# Patient Record
Sex: Male | Born: 1985 | Race: White | Hispanic: No | Marital: Single | Smoking: Current every day smoker
Health system: Southern US, Community
[De-identification: ages and names within clinical notes are randomized; demographics above are authoritative.]

## PROBLEM LIST (undated history)

## (undated) DIAGNOSIS — B2790 Infectious mononucleosis, unspecified without complication: Secondary | ICD-10-CM

## (undated) HISTORY — PX: TOE AMPUTATION: SHX809

## (undated) HISTORY — PX: OTHER SURGICAL HISTORY: SHX169

---
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
## 2013-12-18 ENCOUNTER — Ambulatory Visit: Payer: Self-pay

## 2016-03-27 ENCOUNTER — Encounter: Payer: Self-pay | Admitting: Emergency Medicine

## 2016-03-27 ENCOUNTER — Emergency Department: Payer: Managed Care, Other (non HMO)

## 2016-03-27 ENCOUNTER — Encounter: Payer: Self-pay | Admitting: *Deleted

## 2016-03-27 ENCOUNTER — Ambulatory Visit
Admission: EM | Admit: 2016-03-27 | Discharge: 2016-03-27 | Disposition: A | Discharge status: Other Acute Inpatient Hospital | Payer: Managed Care, Other (non HMO) | Attending: Family Medicine | Admitting: Family Medicine

## 2016-03-27 ENCOUNTER — Emergency Department
Admission: EM | Admit: 2016-03-27 | Discharge: 2016-03-27 | Disposition: A | Discharge status: Home / Self Care | Payer: Managed Care, Other (non HMO) | Attending: Emergency Medicine | Admitting: Emergency Medicine

## 2016-03-27 DIAGNOSIS — R1031 Right lower quadrant pain: Secondary | ICD-10-CM | POA: Diagnosis not present

## 2016-03-27 DIAGNOSIS — Z791 Long term (current) use of non-steroidal anti-inflammatories (NSAID): Secondary | ICD-10-CM | POA: Diagnosis not present

## 2016-03-27 DIAGNOSIS — F17210 Nicotine dependence, cigarettes, uncomplicated: Secondary | ICD-10-CM | POA: Insufficient documentation

## 2016-03-27 DIAGNOSIS — N201 Calculus of ureter: Secondary | ICD-10-CM | POA: Diagnosis not present

## 2016-03-27 DIAGNOSIS — R339 Retention of urine, unspecified: Secondary | ICD-10-CM | POA: Diagnosis not present

## 2016-03-27 DIAGNOSIS — G8929 Other chronic pain: Secondary | ICD-10-CM | POA: Diagnosis not present

## 2016-03-27 LAB — BASIC METABOLIC PANEL
Anion gap: 10 (ref 5–15)
BUN: 14 mg/dL (ref 6–20)
CHLORIDE: 105 mmol/L (ref 101–111)
CO2: 22 mmol/L (ref 22–32)
Calcium: 9.6 mg/dL (ref 8.9–10.3)
Creatinine, Ser: 1.06 mg/dL (ref 0.61–1.24)
GFR calc non Af Amer: >60 mL/min (ref >60)
Glucose, Bld: 128 mg/dL — ABNORMAL HIGH (ref 65–99)
POTASSIUM: 3.6 mmol/L (ref 3.5–5.1)
SODIUM: 137 mmol/L (ref 135–145)

## 2016-03-27 LAB — CBC
HEMATOCRIT: 46.2 % (ref 40.0–52.0)
Hemoglobin: 16.2 g/dL (ref 13.0–18.0)
MCH: 31.9 pg (ref 26.0–34.0)
MCHC: 35.1 g/dL (ref 32.0–36.0)
MCV: 90.9 fL (ref 80.0–100.0)
Platelets: 278 10*3/uL (ref 150–440)
RBC: 5.09 MIL/uL (ref 4.40–5.90)
RDW: 13 % (ref 11.5–14.5)
WBC: 16.3 10*3/uL — AB (ref 3.8–10.6)

## 2016-03-27 LAB — URINALYSIS COMPLETE WITH MICROSCOPIC (ARMC ONLY)
BILIRUBIN URINE: NEGATIVE
GLUCOSE, UA: NEGATIVE mg/dL
Ketones, ur: NEGATIVE mg/dL
LEUKOCYTES UA: NEGATIVE
Nitrite: NEGATIVE
Protein, ur: 100 mg/dL — AB
SPECIFIC GRAVITY, URINE: 1.019 (ref 1.005–1.030)
SQUAMOUS EPITHELIAL / LPF: NONE SEEN
pH: 7 (ref 5.0–8.0)

## 2016-03-27 MED ORDER — SODIUM CHLORIDE 0.9 % IV BOLUS (SEPSIS)
1000.0000 mL | Freq: Once | INTRAVENOUS | Status: AC
  Administered 2016-03-27: 1000 mL via INTRAVENOUS

## 2016-03-27 MED ORDER — CEPHALEXIN 500 MG PO CAPS: 500.0000 mg | ORAL_CAPSULE | Freq: Two times a day (BID) | ORAL | Status: DC

## 2016-03-27 MED ORDER — ONDANSETRON 8 MG PO TBDP
8.0000 mg | ORAL_TABLET | Freq: Once | ORAL | Status: AC
  Administered 2016-03-27: 8 mg via ORAL

## 2016-03-27 MED ORDER — IOPAMIDOL (ISOVUE-300) INJECTION 61%
100.0000 mL | Freq: Once | INTRAVENOUS | Status: AC | PRN
  Administered 2016-03-27: 100 mL via INTRAVENOUS

## 2016-03-27 MED ORDER — OXYCODONE-ACETAMINOPHEN 5-325 MG PO TABS: 1.0000 | ORAL_TABLET | ORAL | Status: DC | PRN

## 2016-03-27 MED ORDER — ONDANSETRON HCL 4 MG PO TABS: 4.0000 mg | ORAL_TABLET | Freq: Three times a day (TID) | ORAL | Status: DC | PRN

## 2016-03-27 MED ORDER — DIATRIZOATE MEGLUMINE & SODIUM 66-10 % PO SOLN
15.0000 mL | Freq: Once | ORAL | Status: AC
  Administered 2016-03-27: 15 mL via ORAL

## 2016-03-27 MED ORDER — TAMSULOSIN HCL 0.4 MG PO CAPS: 0.4000 mg | ORAL_CAPSULE | Freq: Every day | ORAL | Status: DC

## 2016-03-27 MED ORDER — KETOROLAC TROMETHAMINE 60 MG/2ML IM SOLN
60.0000 mg | Freq: Once | INTRAMUSCULAR | Status: AC
  Administered 2016-03-27: 60 mg via INTRAMUSCULAR

## 2016-03-27 MED ORDER — CEPHALEXIN 500 MG PO CAPS
500.0000 mg | ORAL_CAPSULE | Freq: Once | ORAL | Status: AC
  Administered 2016-03-27: 500 mg via ORAL
  Filled 2016-03-27: qty 1

## 2016-03-27 MED ORDER — KETOROLAC TROMETHAMINE 10 MG PO TABS: 10.0000 mg | ORAL_TABLET | Freq: Three times a day (TID) | ORAL | Status: DC | PRN

## 2016-03-27 NOTE — ED Notes (Signed)
Pt sent by POV to [facility] ED. Report called to Inetta Fermoina, Charity fundraiserN.

## 2016-03-27 NOTE — ED Notes (Signed)
Pt with R flank pain that came on suddenly 1 hour ago.  Pt states he also started having trouble urinating last night.  Pt states he had 1 "decent" urination this morning, but has had a "trickle" since then, even with the urge to urinate.  Pt states vomiting x6 today.

## 2016-03-27 NOTE — ED Notes (Signed)
Pt states this is not a workman's comp case.

## 2016-03-27 NOTE — ED Notes (Signed)
Peri-umbilical pain radiating to RLQ and back. N/V/D and diaphoresis.

## 2016-03-27 NOTE — Discharge Instructions (Signed)
You were evaluated for right-sided pain and found to have a kidney stone and possible urinary tract infection. Return to private for any worsening condition including inability to urinate, fever, worsening pain, vomiting and cannot keep her medications down, or any other symptoms concerning to.  Strain your urine, if you collect your stone you may take it to your primary care physician's office for analysis. If you have not passed the stone and are still having symptoms in a week you will need to see a urologist.   Kidney Stones Kidney stones (urolithiasis) are deposits that form inside your kidneys. The intense pain is caused by the stone moving through the urinary tract. When the stone moves, the ureter goes into spasm around the stone. The stone is usually passed in the urine.  CAUSES   A disorder that makes certain neck glands produce too much parathyroid hormone (primary hyperparathyroidism).  A buildup of uric acid crystals, similar to gout in your joints.  Narrowing (stricture) of the ureter.  A kidney obstruction present at birth (congenital obstruction).  Previous surgery on the kidney or ureters.  Numerous kidney infections. SYMPTOMS   Feeling sick to your stomach (nauseous).  Throwing up (vomiting).  Blood in the urine (hematuria).  Pain that usually spreads (radiates) to the groin.  Frequency or urgency of urination. DIAGNOSIS   Taking a history and physical exam.  Blood or urine tests.  CT scan.  Occasionally, an examination of the inside of the urinary bladder (cystoscopy) is performed. TREATMENT   Observation.  Increasing your fluid intake.  Extracorporeal shock wave lithotripsy--This is a noninvasive procedure that uses shock waves to break up kidney stones.  Surgery may be needed if you have severe pain or persistent obstruction. There are various surgical procedures. Most of the procedures are performed with the use of small instruments. Only small  incisions are needed to accommodate these instruments, so recovery time is minimized. The size, location, and chemical composition are all important variables that will determine the proper choice of action for you. Talk to your health care provider to better understand your situation so that you will minimize the risk of injury to yourself and your kidney.  HOME CARE INSTRUCTIONS   Drink enough water and fluids to keep your urine clear or pale yellow. This will help you to pass the stone or stone fragments.  Strain all urine through the provided strainer. Keep all particulate matter and stones for your health care provider to see. The stone causing the pain may be as small as a grain of salt. It is very important to use the strainer each and every time you pass your urine. The collection of your stone will allow your health care provider to analyze it and verify that a stone has actually passed. The stone analysis will often identify what you can do to reduce the incidence of recurrences.  Only take over-the-counter or prescription medicines for pain, discomfort, or fever as directed by your health care provider.  Keep all follow-up visits as told by your health care provider. This is important.  Get follow-up X-rays if required. The absence of pain does not always mean that the stone has passed. It may have only stopped moving. If the urine remains completely obstructed, it can cause loss of kidney function or even complete destruction of the kidney. It is your responsibility to make sure X-rays and follow-ups are completed. Ultrasounds of the kidney can show blockages and the status of the kidney. Ultrasounds are  not associated with any radiation and can be performed easily in a matter of minutes.  Make changes to your daily diet as told by your health care provider. You may be told to:  Limit the amount of salt that you eat.  Eat 5 or more servings of fruits and vegetables each day.  Limit  the amount of meat, poultry, fish, and eggs that you eat.  Collect a 24-hour urine sample as told by your health care provider.You may need to collect another urine sample every 6-12 months. SEEK MEDICAL CARE IF:  You experience pain that is progressive and unresponsive to any pain medicine you have been prescribed. SEEK IMMEDIATE MEDICAL CARE IF:   Pain cannot be controlled with the prescribed medicine.  You have a fever or shaking chills.  The severity or intensity of pain increases over 18 hours and is not relieved by pain medicine.  You develop a new onset of abdominal pain.  You feel faint or pass out.  You are unable to urinate.   This information is not intended to replace advice given to you by your health care provider. Make sure you discuss any questions you have with your health care provider.   Document Released: 09/11/2005 Document Revised: 06/02/2015 Document Reviewed: 02/12/2013 Elsevier Interactive Patient Education [employer]2016 [employer]vier Inc.

## 2016-03-27 NOTE — ED Provider Notes (Signed)
CSN: [id number]     Arrival date & time 03/27/16  [id number] History   First MD Initiated Contact with Patient 03/27/16 [phone]     Chief Complaint  Patient presents with  . Abdominal Pain   (Consider location/radiation/quality/duration/timing/severity/associated sxs/prior Treatment) HPI Comments: Patient presents with difficulty urinating that started last night. Then started having right sided lower abdominal pain early this morning. Pain has continued to get worse and now is a 10/10. Pain is radiating to the right flank area. No fever. Just started with nausea, vomiting and diarrhea about 1 hour ago. No history of kidney stones. No prior surgery. Unable to urinate. Last time he was able to urinate was about 12 hours ago.   Patient is a 30 y.o. male presenting with abdominal pain.  Abdominal Pain   History reviewed. No pertinent past medical history. History reviewed. No pertinent past surgical history. History reviewed. No pertinent family history. Social History  Substance Use Topics  . Smoking status: Never Smoker   . Smokeless tobacco: None  . Alcohol Use: Yes    Review of Systems  Gastrointestinal: Positive for abdominal pain.    Allergies  Sulfa antibiotics  Home Medications   Prior to Admission medications   Medication Sig Start Date End Date Taking? Authorizing Provider  ibuprofen (ADVIL,MOTRIN) 800 MG tablet Take 800 mg by mouth every 8 (eight) hours as needed.   Yes Historical Provider, MD   Meds Ordered and Administered this Visit   Medications  ondansetron (ZOFRAN-ODT) disintegrating tablet 8 mg (8 mg Oral Given 03/27/16 1001)  ketorolac (TORADOL) injection 60 mg (60 mg Intramuscular Given 03/27/16 1028)    BP 131/66 mmHg  Pulse 46  Temp(Src) 97.5 F (36.4 C) (Oral)  Resp 16  Ht 5\' 11"  (1.803 m)  Wt 235 lb (106.595 kg)  BMI 32.79 kg/m2  SpO2 100% No data found.   Physical Exam  Constitutional: He is oriented to person, place, and time. He appears  well-developed and well-nourished. He appears distressed (Patient appears to be in extreme pain. Having difficulty sitting or standing still. ).  Cardiovascular: Normal rate, regular rhythm and normal heart sounds.   Pulmonary/Chest: Effort normal and breath sounds normal.  Abdominal: Soft. Normal appearance. Bowel sounds are decreased. There is no hepatosplenomegaly. There is tenderness in the right lower quadrant. There is rebound, guarding, CVA tenderness (right) and positive Murphy's sign.  Neurological: He is alert and oriented to person, place, and time.  Skin: Skin is warm.    ED Course  Procedures (including critical care time)  Labs Review Labs Reviewed - No data to display  Imaging Review No results found.   Visual Acuity Review  Right Eye Distance:   Left Eye Distance:   Bilateral Distance:    Right Eye Near:   Left Eye Near:    Bilateral Near:         MDM   1. Abdominal pain, chronic, right lower quadrant   2. Urinary retention    Consulted with Dr. Thurmond ButtsWade. Since pt is unable to urinate and still having extreme persistent pain, recommend patient be transferred to the ER for further evaluation. Called [facility] ER and spoke with Charge Nurse, Judeth CornfieldStephanie, regarding sending the patient for evaluation. Patient is coming by personal vehicle (his co-worker is bringing him to the ER) now.       Sudie GrumblingAnn Berry Alane Hanssen, NP 03/27/16 [phone]

## 2016-03-27 NOTE — ED Notes (Signed)
Patient waited 15 minutes and no reactions were noted. Injection site is unremarkable. 

## 2016-03-27 NOTE — ED Notes (Signed)
Pt reports voiding this am and now not being able to void - right sided flank pain present 7/10  Nausea with one episode of emesis

## 2016-03-27 NOTE — ED Provider Notes (Signed)
[facility] Emergency Department Provider Note   ____________________________________________  Time seen: I have reviewed the triage vital signs and the triage nursing note.  HISTORY  Chief Complaint Flank Pain   Historian Patient  HPI Roberto Lutz is a 30 y.o. male with no significant PMH, here for rlq and right flank pain.  Last night had trouble with urination and urinated dark urine.  This morning he had some right-sided flank pain down into the right lower quadrant. He's had some suprapubic pressure. When he was driving he felt like he needed to urinate but then had a bowel movement instead. Some nausea with one episode of emesis. No diarrhea. After the nausea episode he did develop a severe right flank pain that seemed to feel better when he placed pressure on his right flank. He was seen at urgent care and given Toradol and Zofran and feels a little better here. Currently pain 3 out of 10.    History reviewed. No pertinent past medical history.  There are no active problems to display for this patient.   History reviewed. No pertinent past surgical history.  Current Outpatient Rx  Name  Route  Sig  Dispense  Refill  . cephALEXin (KEFLEX) 500 MG capsule   Oral   Take 1 capsule (500 mg total) by mouth 2 (two) times daily.   14 capsule   0   . ibuprofen (ADVIL,MOTRIN) 800 MG tablet   Oral   Take 800 mg by mouth every 8 (eight) hours as needed.         Marland Kitchen ketorolac (TORADOL) 10 MG tablet   Oral   Take 1 tablet (10 mg total) by mouth every 8 (eight) hours as needed for moderate pain.   15 tablet   0   . ondansetron (ZOFRAN) 4 MG tablet   Oral   Take 1 tablet (4 mg total) by mouth every 8 (eight) hours as needed for nausea or vomiting.   10 tablet   0   . oxyCODONE-acetaminophen (ROXICET) 5-325 MG tablet   Oral   Take 1 tablet by mouth every 4 (four) hours as needed for severe pain.   10 tablet   0   . tamsulosin (FLOMAX) 0.4  MG CAPS capsule   Oral   Take 1 capsule (0.4 mg total) by mouth daily.   7 capsule   0     Allergies Sulfa antibiotics  No family history on file.  Social History Social History  Substance Use Topics  . Smoking status: Current Every Day Smoker -- 0.50 packs/day    Types: Cigarettes  . Smokeless tobacco: None  . Alcohol Use: Yes     Comment: occasionally    Review of Systems  Constitutional: Negative for fever. Eyes: Negative for visual changes. ENT: Negative for sore throat. Cardiovascular: Negative for chest pain. Respiratory: Negative for shortness of breath. Gastrointestinal:As per history of present illness. Genitourinary: Positive for dysuria. Musculoskeletal: Negative for back pain. Skin: Negative for rash. Neurological: Negative for headache. 10 point Review of Systems otherwise negative ____________________________________________   PHYSICAL EXAM:  VITAL SIGNS: ED Triage Vitals  Enc Vitals Group     BP 03/27/16 1105 133/72 mmHg     Pulse Rate 03/27/16 1105 50     Resp 03/27/16 1105 20     Temp 03/27/16 1105 97.4 F (36.3 C)     Temp Source 03/27/16 1105 Oral     SpO2 03/27/16 1105 100 %  Weight 03/27/16 1105 235 lb (106.595 kg)     Height 03/27/16 1105 5\' 11"  (1.803 m)     Head Cir --      Peak Flow --      Pain Score 03/27/16 1107 8     Pain Loc --      Pain Edu? --      Excl. in GC? --      Constitutional: Alert and oriented. Well appearing and in no distress. HEENT   Head: Normocephalic and atraumatic.      Eyes: Conjunctivae are normal. PERRL. Normal extraocular movements.      Ears:         Nose: No congestion/rhinnorhea.   Mouth/Throat: Mucous membranes are moist.   Neck: No stridor. Cardiovascular/Chest: Normal rate, regular rhythm.  No murmurs, rubs, or gallops. Respiratory: Normal respiratory effort without tachypnea nor retractions. Breath sounds are clear and equal bilaterally. No  wheezes/rales/rhonchi. Gastrointestinal: Soft. No distention, no guarding, no rebound. Moderate tenderness of the right CVA as well as palpation in the suprapubic and right lower quadrant.  Genitourinary/rectal:Deferred Musculoskeletal: Nontender with normal range of motion in all extremities. No joint effusions.  No lower extremity tenderness.  No edema. Neurologic:  Normal speech and language. No gross or focal neurologic deficits are appreciated. Skin:  Skin is warm, dry and intact. No rash noted. Psychiatric: Mood and affect are normal. Speech and behavior are normal. Patient exhibits appropriate insight and judgment.  ____________________________________________   EKG I, Governor Rooksebecca Arrietty Dercole, MD, the attending physician have personally viewed and interpreted all ECGs.  None ____________________________________________  LABS (pertinent positives/negatives)  Labs Reviewed  URINALYSIS COMPLETEWITH MICROSCOPIC ([facility] ONLY) - Abnormal; Notable for the following:    Color, Urine YELLOW (*)    APPearance CLEAR (*)    Hgb urine dipstick 3+ (*)    Protein, ur 100 (*)    Bacteria, UA RARE (*)    All other components within normal limits  BASIC METABOLIC PANEL - Abnormal; Notable for the following:    Glucose, Bld 128 (*)    All other components within normal limits  CBC - Abnormal; Notable for the following:    WBC 16.3 (*)    All other components within normal limits  URINE CULTURE    ____________________________________________  RADIOLOGY All Xrays were viewed by me. Imaging interpreted by Radiologist.  CT abdomen and pelvis with contrast:  IMPRESSION: 1. 2 mm right UVJ calculus causing moderate obstruction with right-sided hydroureteronephrosis. 2. Small upper pole left renal calculus. 3. No other significant abdominal/pelvic findings. __________________________________________  PROCEDURES  Procedure(s) performed: None  Critical Care performed:  None  ____________________________________________   ED COURSE / ASSESSMENT AND PLAN  Pertinent labs & imaging results that were available during my care of the patient were reviewed by me and considered in my medical decision making (see chart for details).   Initially the patient's symptoms sounded like urinary in origin, possibly kidney stone with the right flank discomfort and dysuria as well as dark urine. However he has more tenderness at the palpation in the right lower quadrant than I would expect for kidney stone, and does not want to jump up and down making me more concerned for possible appendicitis given the location of his pain in the right lower quadrant. Also his white blood count is elevated to 16 although nonspecific, makes me concerned for the necessity to rule out appendicitis as the source of his discomfort.  We discussed risks versus benefit of obtaining CT and  chose to proceed.   CT scan shows distal right millimeter UVJ calculus with moderate obstruction. The patient himself is able to urinate, and does not exhibit signs of systemic illness or sepsis. His urinalysis does show some bacteria, but this may be contamination. I am going to cover him with Keflex and sent a culture.  He is symptomatically controlled and I will discharge him with medications for treatment at home. We discussed return precautions.    CONSULTATIONS:  None   Patient / Family / Caregiver informed of clinical course, medical decision-making process, and agree with plan.   I discussed return precautions, follow-up instructions, and discharged instructions with patient and/or family.   ___________________________________________   FINAL CLINICAL IMPRESSION(S) / ED DIAGNOSES   Final diagnoses:  Ureterolithiasis              Note: This dictation was prepared with Dragon dictation. Any transcriptional errors that result from this process are unintentional   Governor Rooksebecca Perpetua Elling,  MD 03/27/16 [phone]

## 2016-03-28 LAB — URINE CULTURE: CULTURE: NO GROWTH

## 2016-09-05 ENCOUNTER — Encounter: Payer: Self-pay | Admitting: Emergency Medicine

## 2016-09-05 ENCOUNTER — Emergency Department: Payer: Managed Care, Other (non HMO)

## 2016-09-05 ENCOUNTER — Inpatient Hospital Stay
Admission: EM | Admit: 2016-09-05 | Discharge: 2016-09-07 | DRG: 476 | Disposition: A | Discharge status: Home / Self Care | Payer: Managed Care, Other (non HMO) | Attending: Internal Medicine | Admitting: Internal Medicine

## 2016-09-05 ENCOUNTER — Encounter
Admission: EM | Disposition: A | Discharge status: Home / Self Care | Payer: Self-pay | Source: Home / Self Care | Attending: Internal Medicine

## 2016-09-05 ENCOUNTER — Inpatient Hospital Stay: Payer: Managed Care, Other (non HMO) | Admitting: Certified Registered"

## 2016-09-05 DIAGNOSIS — Y906 Blood alcohol level of 120-199 mg/100 ml: Secondary | ICD-10-CM | POA: Diagnosis present

## 2016-09-05 DIAGNOSIS — S92511B Displaced fracture of proximal phalanx of right lesser toe(s), initial encounter for open fracture: Secondary | ICD-10-CM | POA: Diagnosis present

## 2016-09-05 DIAGNOSIS — F10129 Alcohol abuse with intoxication, unspecified: Secondary | ICD-10-CM | POA: Diagnosis present

## 2016-09-05 DIAGNOSIS — S92301B Fracture of unspecified metatarsal bone(s), right foot, initial encounter for open fracture: Secondary | ICD-10-CM

## 2016-09-05 DIAGNOSIS — S92351A Displaced fracture of fifth metatarsal bone, right foot, initial encounter for closed fracture: Secondary | ICD-10-CM | POA: Diagnosis present

## 2016-09-05 DIAGNOSIS — F17210 Nicotine dependence, cigarettes, uncomplicated: Secondary | ICD-10-CM | POA: Diagnosis present

## 2016-09-05 DIAGNOSIS — Z23 Encounter for immunization: Secondary | ICD-10-CM | POA: Diagnosis not present

## 2016-09-05 DIAGNOSIS — Z91018 Allergy to other foods: Secondary | ICD-10-CM

## 2016-09-05 DIAGNOSIS — W3301XA Accidental discharge of shotgun, initial encounter: Secondary | ICD-10-CM

## 2016-09-05 DIAGNOSIS — S92901A Unspecified fracture of right foot, initial encounter for closed fracture: Secondary | ICD-10-CM | POA: Diagnosis present

## 2016-09-05 DIAGNOSIS — W3400XA Accidental discharge from unspecified firearms or gun, initial encounter: Secondary | ICD-10-CM

## 2016-09-05 DIAGNOSIS — Z882 Allergy status to sulfonamides status: Secondary | ICD-10-CM | POA: Diagnosis not present

## 2016-09-05 DIAGNOSIS — M79671 Pain in right foot: Secondary | ICD-10-CM | POA: Diagnosis present

## 2016-09-05 DIAGNOSIS — S99921A Unspecified injury of right foot, initial encounter: Secondary | ICD-10-CM | POA: Diagnosis present

## 2016-09-05 HISTORY — PX: AMPUTATION TOE: SHX6595

## 2016-09-05 HISTORY — DX: Infectious mononucleosis, unspecified without complication: B27.90

## 2016-09-05 LAB — BASIC METABOLIC PANEL
ANION GAP: 10 (ref 5–15)
ANION GAP: 7 (ref 5–15)
BUN: 10 mg/dL (ref 6–20)
BUN: 9 mg/dL (ref 6–20)
CHLORIDE: 106 mmol/L (ref 101–111)
CO2: 27 mmol/L (ref 22–32)
CO2: 24 mmol/L (ref 22–32)
Calcium: 9.1 mg/dL (ref 8.9–10.3)
Calcium: 8.8 mg/dL — ABNORMAL LOW (ref 8.9–10.3)
Chloride: 101 mmol/L (ref 101–111)
Creatinine, Ser: 1.06 mg/dL (ref 0.61–1.24)
Creatinine, Ser: 0.78 mg/dL (ref 0.61–1.24)
GFR calc Af Amer: >60 mL/min (ref >60)
GLUCOSE: 113 mg/dL — AB (ref 65–99)
GLUCOSE: 107 mg/dL — AB (ref 65–99)
POTASSIUM: 3.6 mmol/L (ref 3.5–5.1)
POTASSIUM: 4.2 mmol/L (ref 3.5–5.1)
Sodium: 138 mmol/L (ref 135–145)
Sodium: 137 mmol/L (ref 135–145)

## 2016-09-05 LAB — CBC
HEMATOCRIT: 45.5 % (ref 40.0–52.0)
HEMATOCRIT: 42.5 % (ref 40.0–52.0)
HEMOGLOBIN: 14.8 g/dL (ref 13.0–18.0)
Hemoglobin: 15.9 g/dL (ref 13.0–18.0)
MCH: 32.3 pg (ref 26.0–34.0)
MCH: 32.7 pg (ref 26.0–34.0)
MCHC: 34.9 g/dL (ref 32.0–36.0)
MCHC: 34.8 g/dL (ref 32.0–36.0)
MCV: 92.6 fL (ref 80.0–100.0)
MCV: 93.9 fL (ref 80.0–100.0)
Platelets: 371 10*3/uL (ref 150–440)
Platelets: 335 10*3/uL (ref 150–440)
RBC: 4.91 MIL/uL (ref 4.40–5.90)
RBC: 4.53 MIL/uL (ref 4.40–5.90)
RDW: 12.9 % (ref 11.5–14.5)
RDW: 13.1 % (ref 11.5–14.5)
WBC: 18.1 10*3/uL — AB (ref 3.8–10.6)
WBC: 16.5 10*3/uL — AB (ref 3.8–10.6)

## 2016-09-05 LAB — ETHANOL
ALCOHOL ETHYL (B): 160 mg/dL — AB (ref <5)
Alcohol, Ethyl (B): 54 mg/dL — ABNORMAL HIGH (ref <5)

## 2016-09-05 LAB — TYPE AND SCREEN
ABO/RH(D): A POS
ANTIBODY SCREEN: NEGATIVE

## 2016-09-05 LAB — SURGICAL PCR SCREEN
MRSA, PCR: NEGATIVE
Staphylococcus aureus: NEGATIVE

## 2016-09-05 SURGERY — AMPUTATION, TOE
Anesthesia: General | Site: Toe | Laterality: Right | Wound class: Clean Contaminated

## 2016-09-05 MED ORDER — VITAMIN B-1 100 MG PO TABS
100.0000 mg | ORAL_TABLET | Freq: Every day | ORAL | Status: DC
  Administered 2016-09-06 – 2016-09-07 (×2): 100 mg via ORAL
  Filled 2016-09-05 (×2): qty 1

## 2016-09-05 MED ORDER — ONDANSETRON HCL 4 MG/2ML IJ SOLN: 4.0000 mg | Freq: Four times a day (QID) | INTRAMUSCULAR | Status: DC | PRN

## 2016-09-05 MED ORDER — MORPHINE SULFATE (PF) 4 MG/ML IV SOLN
4.0000 mg | Freq: Once | INTRAVENOUS | Status: AC
  Administered 2016-09-06: 4 mg via INTRAVENOUS
  Filled 2016-09-05: qty 1

## 2016-09-05 MED ORDER — ONDANSETRON HCL 4 MG/2ML IJ SOLN: 4.0000 mg | Freq: Once | INTRAMUSCULAR | Status: DC | PRN

## 2016-09-05 MED ORDER — LORAZEPAM 2 MG/ML IJ SOLN: 1.0000 mg | Freq: Four times a day (QID) | INTRAMUSCULAR | Status: DC | PRN

## 2016-09-05 MED ORDER — LACTATED RINGERS IV SOLN
Freq: Once | INTRAVENOUS | Status: AC
  Administered 2016-09-05: 12:00:00 via INTRAVENOUS

## 2016-09-05 MED ORDER — PIPERACILLIN-TAZOBACTAM 3.375 G IVPB
3.3750 g | Freq: Three times a day (TID) | INTRAVENOUS | Status: DC
  Administered 2016-09-05 – 2016-09-07 (×6): 3.375 g via INTRAVENOUS
  Filled 2016-09-05 (×6): qty 50

## 2016-09-05 MED ORDER — HYDROCODONE-ACETAMINOPHEN 5-325 MG PO TABS
1.0000 | ORAL_TABLET | ORAL | Status: DC | PRN
  Administered 2016-09-05: 1 via ORAL
  Administered 2016-09-06 – 2016-09-07 (×6): 2 via ORAL
  Filled 2016-09-05: qty 1
  Filled 2016-09-05 (×6): qty 2

## 2016-09-05 MED ORDER — MORPHINE SULFATE (PF) 2 MG/ML IV SOLN
INTRAVENOUS | Status: AC
  Administered 2016-09-05: 2 mg via INTRAVENOUS
  Filled 2016-09-05: qty 1

## 2016-09-05 MED ORDER — DEXAMETHASONE SODIUM PHOSPHATE 10 MG/ML IJ SOLN
INTRAMUSCULAR | Status: DC | PRN
  Administered 2016-09-05: 10 mg via INTRAVENOUS

## 2016-09-05 MED ORDER — PROPOFOL 10 MG/ML IV BOLUS
INTRAVENOUS | Status: DC | PRN
  Administered 2016-09-05: 200 mg via INTRAVENOUS

## 2016-09-05 MED ORDER — DEXMEDETOMIDINE HCL IN NACL 200 MCG/50ML IV SOLN
INTRAVENOUS | Status: DC | PRN
  Administered 2016-09-05: 12 ug via INTRAVENOUS

## 2016-09-05 MED ORDER — HYDROMORPHONE HCL 1 MG/ML IJ SOLN
1.0000 mg | INTRAMUSCULAR | Status: DC | PRN
  Administered 2016-09-05 (×2): 1 mg via INTRAVENOUS
  Filled 2016-09-05: qty 1

## 2016-09-05 MED ORDER — ADULT MULTIVITAMIN W/MINERALS CH
1.0000 | ORAL_TABLET | Freq: Every day | ORAL | Status: DC
  Administered 2016-09-06 – 2016-09-07 (×2): 1 via ORAL
  Filled 2016-09-05 (×2): qty 1

## 2016-09-05 MED ORDER — MIDAZOLAM HCL 2 MG/2ML IJ SOLN
INTRAMUSCULAR | Status: DC | PRN
  Administered 2016-09-05: 2 mg via INTRAVENOUS

## 2016-09-05 MED ORDER — THIAMINE HCL 100 MG/ML IJ SOLN
100.0000 mg | Freq: Every day | INTRAMUSCULAR | Status: DC
  Administered 2016-09-05: 100 mg via INTRAVENOUS
  Filled 2016-09-05: qty 2

## 2016-09-05 MED ORDER — KETOROLAC TROMETHAMINE 30 MG/ML IJ SOLN: 30.0000 mg | Freq: Four times a day (QID) | INTRAMUSCULAR | Status: DC | PRN

## 2016-09-05 MED ORDER — IPRATROPIUM-ALBUTEROL 0.5-2.5 (3) MG/3ML IN SOLN
3.0000 mL | Freq: Four times a day (QID) | RESPIRATORY_TRACT | Status: DC
  Administered 2016-09-05: 3 mL via RESPIRATORY_TRACT

## 2016-09-05 MED ORDER — CHLORHEXIDINE GLUCONATE 4 % EX LIQD: 60.0000 mL | Freq: Once | CUTANEOUS | Status: DC

## 2016-09-05 MED ORDER — CEFAZOLIN IN D5W 1 GM/50ML IV SOLN
1.0000 g | Freq: Once | INTRAVENOUS | Status: AC
  Administered 2016-09-05: 1 g via INTRAVENOUS
  Filled 2016-09-05: qty 50

## 2016-09-05 MED ORDER — ACETAMINOPHEN 650 MG RE SUPP: 650.0000 mg | Freq: Four times a day (QID) | RECTAL | Status: DC | PRN

## 2016-09-05 MED ORDER — FENTANYL CITRATE (PF) 100 MCG/2ML IJ SOLN
INTRAMUSCULAR | Status: DC | PRN
  Administered 2016-09-05 (×2): 50 ug via INTRAVENOUS

## 2016-09-05 MED ORDER — LORAZEPAM 1 MG PO TABS: 1.0000 mg | ORAL_TABLET | Freq: Four times a day (QID) | ORAL | Status: DC | PRN

## 2016-09-05 MED ORDER — FOLIC ACID 1 MG PO TABS
1.0000 mg | ORAL_TABLET | Freq: Every day | ORAL | Status: DC
  Administered 2016-09-06 – 2016-09-07 (×2): 1 mg via ORAL
  Filled 2016-09-05 (×2): qty 1

## 2016-09-05 MED ORDER — TETANUS-DIPHTH-ACELL PERTUSSIS 5-2.5-18.5 LF-MCG/0.5 IM SUSP
0.5000 mL | Freq: Once | INTRAMUSCULAR | Status: AC
  Administered 2016-09-05: 0.5 mL via INTRAMUSCULAR
  Filled 2016-09-05: qty 0.5

## 2016-09-05 MED ORDER — SENNOSIDES-DOCUSATE SODIUM 8.6-50 MG PO TABS
1.0000 | ORAL_TABLET | Freq: Every evening | ORAL | Status: DC | PRN
Start: 2016-09-05 — End: 2016-09-07

## 2016-09-05 MED ORDER — ONDANSETRON HCL 4 MG/2ML IJ SOLN
INTRAMUSCULAR | Status: AC
  Administered 2016-09-05: 4 mg via INTRAVENOUS
  Filled 2016-09-05: qty 2

## 2016-09-05 MED ORDER — HYDROMORPHONE HCL 1 MG/ML IJ SOLN
INTRAMUSCULAR | Status: AC
  Administered 2016-09-05: 1 mg via INTRAVENOUS
  Filled 2016-09-05: qty 1

## 2016-09-05 MED ORDER — LIDOCAINE HCL (PF) 1 % IJ SOLN
INTRAMUSCULAR | Status: DC | PRN
  Administered 2016-09-05: 5 mL

## 2016-09-05 MED ORDER — LIDOCAINE HCL (PF) 1 % IJ SOLN
INTRAMUSCULAR | Status: AC
  Filled 2016-09-05: qty 30

## 2016-09-05 MED ORDER — BUPIVACAINE HCL (PF) 0.5 % IJ SOLN
INTRAMUSCULAR | Status: DC | PRN
  Administered 2016-09-05: 5 mL
  Administered 2016-09-05: 18 mL

## 2016-09-05 MED ORDER — FENTANYL CITRATE (PF) 100 MCG/2ML IJ SOLN: 25.0000 ug | INTRAMUSCULAR | Status: DC | PRN

## 2016-09-05 MED ORDER — ONDANSETRON HCL 4 MG PO TABS: 4.0000 mg | ORAL_TABLET | Freq: Four times a day (QID) | ORAL | Status: DC | PRN

## 2016-09-05 MED ORDER — ONDANSETRON HCL 4 MG/2ML IJ SOLN
INTRAMUSCULAR | Status: DC | PRN
  Administered 2016-09-05: 4 mg via INTRAVENOUS

## 2016-09-05 MED ORDER — LORAZEPAM 2 MG/ML IJ SOLN: 0.0000 mg | Freq: Four times a day (QID) | INTRAMUSCULAR | Status: AC

## 2016-09-05 MED ORDER — SODIUM CHLORIDE 0.9 % IV SOLN
INTRAVENOUS | Status: DC
  Administered 2016-09-05 – 2016-09-06 (×3): via INTRAVENOUS

## 2016-09-05 MED ORDER — PHENYLEPHRINE HCL 10 MG/ML IJ SOLN
INTRAMUSCULAR | Status: DC | PRN
  Administered 2016-09-05: 100 ug via INTRAVENOUS

## 2016-09-05 MED ORDER — TOBRAMYCIN SULFATE 80 MG/2ML IJ SOLN
5.1000 mg/kg | Freq: Once | INTRAVENOUS | Status: AC
  Administered 2016-09-05: 550 mg via INTRAVENOUS
  Filled 2016-09-05: qty 13.75

## 2016-09-05 MED ORDER — ACETAMINOPHEN 325 MG PO TABS: 650.0000 mg | ORAL_TABLET | Freq: Four times a day (QID) | ORAL | Status: DC | PRN

## 2016-09-05 MED ORDER — ONDANSETRON HCL 4 MG/2ML IJ SOLN
4.0000 mg | Freq: Once | INTRAMUSCULAR | Status: AC
  Administered 2016-09-05: 4 mg via INTRAVENOUS

## 2016-09-05 MED ORDER — GLYCOPYRROLATE 0.2 MG/ML IJ SOLN
INTRAMUSCULAR | Status: DC | PRN
  Administered 2016-09-05: .2 mg via INTRAVENOUS

## 2016-09-05 MED ORDER — BUPIVACAINE HCL (PF) 0.5 % IJ SOLN
INTRAMUSCULAR | Status: AC
  Filled 2016-09-05: qty 30

## 2016-09-05 MED ORDER — LORAZEPAM 2 MG/ML IJ SOLN: 0.0000 mg | Freq: Two times a day (BID) | INTRAMUSCULAR | Status: DC

## 2016-09-05 MED ORDER — IPRATROPIUM-ALBUTEROL 0.5-2.5 (3) MG/3ML IN SOLN: 3.0000 mL | Freq: Four times a day (QID) | RESPIRATORY_TRACT | Status: DC | PRN

## 2016-09-05 MED ORDER — LIDOCAINE HCL (CARDIAC) 20 MG/ML IV SOLN
INTRAVENOUS | Status: DC | PRN
  Administered 2016-09-05: 100 mg via INTRAVENOUS

## 2016-09-05 MED ORDER — IPRATROPIUM-ALBUTEROL 0.5-2.5 (3) MG/3ML IN SOLN
RESPIRATORY_TRACT | Status: AC
  Administered 2016-09-05: 3 mL via RESPIRATORY_TRACT
  Filled 2016-09-05: qty 3

## 2016-09-05 MED ORDER — MORPHINE SULFATE (PF) 2 MG/ML IV SOLN
2.0000 mg | INTRAVENOUS | Status: DC | PRN
  Administered 2016-09-05 – 2016-09-06 (×3): 2 mg via INTRAVENOUS
  Filled 2016-09-05 (×3): qty 1

## 2016-09-05 MED ORDER — CEFAZOLIN SODIUM-DEXTROSE 2-4 GM/100ML-% IV SOLN
2.0000 g | INTRAVENOUS | Status: AC
  Administered 2016-09-05: 2 g via INTRAVENOUS
  Filled 2016-09-05: qty 100

## 2016-09-05 SURGICAL SUPPLY — 48 items
BANDAGE ELASTIC 4 LF NS (GAUZE/BANDAGES/DRESSINGS) ×2 IMPLANT
BANDAGE STRETCH 3X4.1 STRL (GAUZE/BANDAGES/DRESSINGS) IMPLANT
BLADE OSC/SAGITTAL MD 5.5X18 (BLADE) ×2 IMPLANT
BLADE SURG MINI STRL (BLADE) ×2 IMPLANT
BNDG ESMARK 4X12 TAN STRL LF (GAUZE/BANDAGES/DRESSINGS) ×2 IMPLANT
BNDG GAUZE 4.5X4.1 6PLY STRL (MISCELLANEOUS) IMPLANT
CANISTER SUCT 1200ML W/VALVE (MISCELLANEOUS) ×2 IMPLANT
DRAPE FLUOR MINI C-ARM 54X84 (DRAPES) ×2 IMPLANT
DRAPE XRAY CASSETTE 23X24 (DRAPES) ×2 IMPLANT
DRSG VAC ATS MED SENSATRAC (GAUZE/BANDAGES/DRESSINGS) IMPLANT
DURAPREP 26ML APPLICATOR (WOUND CARE) IMPLANT
ELECT REM PT RETURN 9FT ADLT (ELECTROSURGICAL) ×2
ELECTRODE REM PT RTRN 9FT ADLT (ELECTROSURGICAL) ×1 IMPLANT
GAUZE IODOFORM PACK 1/2 7832 (GAUZE/BANDAGES/DRESSINGS) IMPLANT
GAUZE PETRO XEROFOAM 1X8 (MISCELLANEOUS) IMPLANT
GAUZE SPONGE 4X4 12PLY STRL (GAUZE/BANDAGES/DRESSINGS) ×2 IMPLANT
GAUZE STRETCH 2X75IN STRL (MISCELLANEOUS) IMPLANT
GLOVE BIO SURGEON STRL SZ7.5 (GLOVE) ×4 IMPLANT
GLOVE INDICATOR 8.0 STRL GRN (GLOVE) ×4 IMPLANT
GOWN STRL REUS W/ TWL LRG LVL3 (GOWN DISPOSABLE) ×2 IMPLANT
GOWN STRL REUS W/TWL LRG LVL3 (GOWN DISPOSABLE) ×2
HANDPIECE INTERPULSE COAX TIP (DISPOSABLE) ×1
HANDPIECE VERSAJET DEBRIDEMENT (MISCELLANEOUS) ×2 IMPLANT
IV NS 1000ML (IV SOLUTION) ×1
IV NS 1000ML BAXH (IV SOLUTION) ×1 IMPLANT
KIT DRSG VAC SLVR GRANUFM (MISCELLANEOUS) ×2 IMPLANT
KIT RM TURNOVER STRD PROC AR (KITS) ×2 IMPLANT
LABEL OR SOLS (LABEL) ×2 IMPLANT
NEEDLE FILTER BLUNT 18X 1/2SAF (NEEDLE) ×1
NEEDLE FILTER BLUNT 18X1 1/2 (NEEDLE) ×1 IMPLANT
NEEDLE HYPO 25X1 1.5 SAFETY (NEEDLE) ×2 IMPLANT
NS IRRIG 500ML POUR BTL (IV SOLUTION) ×2 IMPLANT
PACK EXTREMITY ARMC (MISCELLANEOUS) ×2 IMPLANT
PAD ABD DERMACEA PRESS 5X9 (GAUZE/BANDAGES/DRESSINGS) IMPLANT
SET HNDPC FAN SPRY TIP SCT (DISPOSABLE) ×1 IMPLANT
SOL .9 NS 3000ML IRR  AL (IV SOLUTION) ×1
SOL .9 NS 3000ML IRR UROMATIC (IV SOLUTION) ×1 IMPLANT
SOL PREP PVP 2OZ (MISCELLANEOUS) ×2
SOLUTION PREP PVP 2OZ (MISCELLANEOUS) ×1 IMPLANT
STOCKINETTE M/LG 89821 (MISCELLANEOUS) ×2 IMPLANT
STRAP SAFETY BODY (MISCELLANEOUS) ×2 IMPLANT
SUT ETHILON 2 0 FS 18 (SUTURE) ×4 IMPLANT
SUT ETHILON 3-0 FS-10 30 BLK (SUTURE) ×2
SUT ETHILON 5-0 FS-2 18 BLK (SUTURE) ×2 IMPLANT
SUT VIC AB 4-0 FS2 27 (SUTURE) ×2 IMPLANT
SUTURE EHLN 3-0 FS-10 30 BLK (SUTURE) ×1 IMPLANT
SYRINGE 10CC LL (SYRINGE) ×6 IMPLANT
WND VAC CANISTER 500ML (MISCELLANEOUS) ×2 IMPLANT

## 2016-09-05 NOTE — ED Notes (Signed)
Dr. Pyreddy at bedside  
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
## 2016-09-05 NOTE — Anesthesia Procedure Notes (Signed)
Procedure Name: LMA Insertion Date/Time: 09/05/2016 12:34 PM Performed by: Michaele OfferSAVAGE, Scarlett Portlock Pre-anesthesia Checklist: Patient identified, Emergency Drugs available, Suction available, Patient being monitored and Timeout performed Patient Re-evaluated:Patient Re-evaluated prior to inductionOxygen Delivery Method: Circle system utilized Preoxygenation: Pre-oxygenation with 100% oxygen Intubation Type: IV induction Ventilation: Mask ventilation without difficulty LMA: LMA inserted LMA Size: 5.0 Number of attempts: 1 Placement Confirmation: positive ETCO2 and breath sounds checked- equal and bilateral Tube secured with: Tape Dental Injury: Teeth and Oropharynx as per pre-operative assessment

## 2016-09-05 NOTE — Transfer of Care (Signed)
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 toe   Patient Location: PACU  Anesthesia Type:General  Level of Consciousness: awake, oriented and patient cooperative  Airway & Oxygen Therapy: Patient Spontanous Breathing and Patient connected to face mask oxygen  Post-op Assessment: Report given to RN, Post -op Vital signs reviewed and stable and Patient moving all extremities X 4  Post vital signs: Reviewed and stable  Last Vitals:  Vitals:   09/05/16 1155 09/05/16 1354  BP: 126/73   Pulse: 80   Resp: 16   Temp: (!) 36 C (P) 36.4 C    Last Pain:  Vitals:   09/05/16 1155  TempSrc: Tympanic  PainSc: 5          Complications: No apparent anesthesia complications
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
## 2016-09-05 NOTE — Progress Notes (Signed)
Patient made aware that surgery as of now is slated for around noon today. Patient reminded of NPO status and not to eat or drink anything. Patient verbalized understanding.
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
## 2016-09-05 NOTE — Progress Notes (Addendum)
New Admit  Arrival Method: From ED Mental Orientation: Alert and oriented X4, poor judgment Assessment: ETOH, moderate red drainage to dressing on right toe. CIWA score 0. Skin: Wound to right 5th toe, UTA related to injury Pain: Denies Safety Measures: Bed alarm on, CIWA q6h Admission: Complete Family: updated, at bedside  Patient is agitated and states that "I can walk out of here" if he is still NPO after 10AM and hasn't been to the OR by then.
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
## 2016-09-05 NOTE — ED Notes (Signed)
Spoke with Dr. Tobi BastosPyreddy regarding toradol order and potential surgery for patient.  MD to change order to dilaudid standing order.

## 2016-09-05 NOTE — Anesthesia Postprocedure Evaluation (Signed)
Anesthesia Post Note  Patient: Roberto Lutz  Procedure(s) Performed: Procedure(s) (LRB): AMPUTATION TOE (Right)  Patient location during evaluation: PACU Anesthesia Type: General Level of consciousness: awake and alert Pain management: pain level controlled Vital Signs Assessment: post-procedure vital signs reviewed and stable Respiratory status: spontaneous breathing, nonlabored ventilation, respiratory function stable and patient connected to nasal cannula oxygen Cardiovascular status: blood pressure returned to baseline and stable Postop Assessment: no signs of nausea or vomiting Anesthetic complications: no    Last Vitals:  Vitals:   09/05/16 1637 09/05/16 1740  BP: 136/65 (!) 122/50  Pulse: 76 74  Resp: 16 18  Temp: 36.8 C 37 C    Last Pain:  Vitals:   09/05/16 1740  TempSrc: Oral  PainSc:                  Tyiana Hill S

## 2016-09-06 ENCOUNTER — Encounter: Payer: Self-pay | Admitting: Podiatry

## 2016-09-06 MED ORDER — PANTOPRAZOLE SODIUM 40 MG PO TBEC
40.0000 mg | DELAYED_RELEASE_TABLET | Freq: Every day | ORAL | Status: DC
  Administered 2016-09-06 – 2016-09-07 (×2): 40 mg via ORAL
  Filled 2016-09-06 (×2): qty 1

## 2016-09-06 MED ORDER — TOBRAMYCIN SULFATE 80 MG/2ML IJ SOLN: 5.1000 mg/kg | Freq: Once | INTRAVENOUS | Status: DC

## 2016-09-06 MED ORDER — GUAIFENESIN ER 600 MG PO TB12
600.0000 mg | ORAL_TABLET | Freq: Two times a day (BID) | ORAL | Status: DC | PRN
  Administered 2016-09-06: 600 mg via ORAL
  Filled 2016-09-06: qty 1

## 2016-09-06 MED ORDER — IBUPROFEN 400 MG PO TABS: 400.0000 mg | ORAL_TABLET | Freq: Three times a day (TID) | ORAL | Status: DC | PRN

## 2016-09-06 MED ORDER — GUAIFENESIN 200 MG PO TABS
200.0000 mg | ORAL_TABLET | ORAL | Status: DC | PRN
  Filled 2016-09-06: qty 1

## 2016-09-06 MED ORDER — TOBRAMYCIN SULFATE 80 MG/2ML IJ SOLN
5.1000 mg/kg | Freq: Once | INTRAVENOUS | Status: AC
  Administered 2016-09-06: 550 mg via INTRAVENOUS
  Filled 2016-09-06 (×2): qty 13.75
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
## 2016-09-07 LAB — SURGICAL PATHOLOGY

## 2016-09-07 MED ORDER — HYDROCODONE-ACETAMINOPHEN 5-325 MG PO TABS: 1.0000 | ORAL_TABLET | ORAL | 0 refills | Status: AC | PRN

## 2016-09-07 MED ORDER — CIPROFLOXACIN HCL 500 MG PO TABS: 500.0000 mg | ORAL_TABLET | Freq: Two times a day (BID) | ORAL | 0 refills | Status: AC

## 2016-09-07 MED ORDER — CEPHALEXIN 500 MG PO CAPS: 500.0000 mg | ORAL_CAPSULE | Freq: Two times a day (BID) | ORAL | 0 refills | Status: AC

## 2016-09-07 MED ORDER — CEPHALEXIN 500 MG PO CAPS: 500.0000 mg | ORAL_CAPSULE | Freq: Two times a day (BID) | ORAL | Status: DC

## 2016-09-07 MED ORDER — CIPROFLOXACIN HCL 500 MG PO TABS: 500.0000 mg | ORAL_TABLET | Freq: Two times a day (BID) | ORAL | Status: DC

## 2016-09-07 NOTE — Progress Notes (Signed)
Pt being discharged home today. PIV removed. Discharge instructions reviewed with pt, all questions answered. He will have family to help with daily dressing changes at home. Prescriptions for abx sent to pharmacy, pt was given prescriptions for pain medication. He will follow up with Dr Ether GriffinsFowler next Wednesday in [place] [facility]. He has all needed DME at home. He is leaving with all of his belongings, will be transported home via family.

## 2016-09-07 NOTE — Progress Notes (Signed)
Dressing changed today. No purulence. Minimal erythema. Minimal bloody drainage.  OK to d/c from podiatry standpoint. NWB to foot. Dressings:  Recommend daily dressing changes with dry gauze dressing and kerlex wrap. Recommend po abx for 5 days with Keflex and Cipro. F/U with me next Wednesday in [place].

## 2016-09-07 NOTE — Discharge Instructions (Signed)
Dressings:  Recommend daily dressing changes with dry gauze dressing and kerlex wrap.  Patient advised to use crutches/knee walker at home per PT  NWB to foot.  Recommend po abx for 5 days with Keflex and Cipro.  F/U with me next Wednesday in [place].
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
## 2016-09-07 NOTE — Care Management (Addendum)
Case discussed with Dr. Ether GriffinsFowler. It is anticipated that patient will not be discharged with wound vac. No PT follow up recommended. Home with self care.
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
## 2016-09-11 LAB — AEROBIC/ANAEROBIC CULTURE W GRAM STAIN (SURGICAL/DEEP WOUND)

## 2016-11-08 ENCOUNTER — Ambulatory Visit
Admission: EM | Admit: 2016-11-08 | Discharge: 2016-11-08 | Disposition: A | Discharge status: Home / Self Care | Payer: BLUE CROSS/BLUE SHIELD | Attending: Family Medicine | Admitting: Family Medicine

## 2016-11-08 ENCOUNTER — Encounter: Payer: Self-pay | Admitting: *Deleted

## 2016-11-08 DIAGNOSIS — J111 Influenza due to unidentified influenza virus with other respiratory manifestations: Secondary | ICD-10-CM

## 2016-11-08 DIAGNOSIS — R69 Illness, unspecified: Secondary | ICD-10-CM

## 2016-11-08 DIAGNOSIS — K529 Noninfective gastroenteritis and colitis, unspecified: Secondary | ICD-10-CM | POA: Diagnosis not present

## 2016-11-08 LAB — BASIC METABOLIC PANEL
Anion gap: 11 (ref 5–15)
BUN: 14 mg/dL (ref 6–20)
CALCIUM: 9.4 mg/dL (ref 8.9–10.3)
CHLORIDE: 103 mmol/L (ref 101–111)
CO2: 21 mmol/L — AB (ref 22–32)
CREATININE: 1.04 mg/dL (ref 0.61–1.24)
GFR calc non Af Amer: >60 mL/min (ref >60)
GLUCOSE: 95 mg/dL (ref 65–99)
Potassium: 4.2 mmol/L (ref 3.5–5.1)
Sodium: 135 mmol/L (ref 135–145)

## 2016-11-08 MED ORDER — ONDANSETRON 8 MG PO TBDP
8.0000 mg | ORAL_TABLET | Freq: Once | ORAL | Status: AC
  Administered 2016-11-08: 8 mg via ORAL

## 2016-11-08 MED ORDER — ONDANSETRON 8 MG PO TBDP: 8.0000 mg | ORAL_TABLET | Freq: Three times a day (TID) | ORAL | 0 refills | Status: AC | PRN

## 2016-11-08 MED ORDER — OSELTAMIVIR PHOSPHATE 75 MG PO CAPS: 75.0000 mg | ORAL_CAPSULE | Freq: Two times a day (BID) | ORAL | 0 refills | Status: AC

## 2016-11-08 NOTE — ED Provider Notes (Signed)
[facility]    CSN: [id number] Arrival date & time: 11/08/16  1829     History   Chief Complaint Chief Complaint  Patient presents with  . Diarrhea  . Nausea  . Emesis  . Fever  . Generalized Body Aches  . Chills    HPI Roberto Lutz is a 31 y.o. male.   The history is provided by the patient.  Diarrhea  Quality:  Watery Severity:  Moderate Onset quality:  Sudden Duration:  1 day Timing:  Constant Progression:  Unchanged Relieved by:  None tried Associated symptoms: chills, cough, fever, myalgias, URI and vomiting   Associated symptoms: no abdominal pain, no arthralgias, no diaphoresis and no headaches   Risk factors: sick contacts   Risk factors: no recent antibiotic use, no suspicious food intake and no travel to endemic areas   Emesis  Associated symptoms: chills, cough, diarrhea, fever, myalgias and URI   Associated symptoms: no abdominal pain, no arthralgias and no headaches   Fever  Associated symptoms: chills, congestion, cough, diarrhea, myalgias and vomiting   Associated symptoms: no headaches   URI  Presenting symptoms: congestion, cough, fatigue and fever   Severity:  Moderate Onset quality:  Sudden Duration:  1 day Timing:  Constant Progression:  Worsening Chronicity:  New Relieved by:  None tried Associated symptoms: myalgias   Associated symptoms: no arthralgias, no headaches and no wheezing     Past Medical History:  Diagnosis Date  . Infectious mononucleosis     Patient Active Problem List   Diagnosis Date Noted  . Right foot injury 09/05/2016  . Foot fracture, right 09/05/2016    Past Surgical History:  Procedure Laterality Date  . AMPUTATION TOE Right 09/05/2016   Procedure: AMPUTATION TOE;  Surgeon: Gwyneth Revels, DPM;  Location: [facility] OR[facility];  Service: Podiatry;  Laterality: Right;  5th toe   . none    . TOE AMPUTATION Right        Home Medications    Prior to Admission medications   Medication Sig  Start Date End Date Taking? Authorizing Provider  ibuprofen (ADVIL,MOTRIN) 800 MG tablet Take 800 mg by mouth every 8 (eight) hours as needed for headache.    Yes Historical Provider, MD  cephALEXin (KEFLEX) 500 MG capsule Take 1 capsule (500 mg total) by mouth every 12 (twelve) hours. 09/07/16   Enedina Finner, MD  ciprofloxacin (CIPRO) 500 MG tablet Take 1 tablet (500 mg total) by mouth 2 (two) times daily. 09/07/16   Enedina Finner, MD  dextromethorphan-guaiFENesin (MUCINEX DM) 30-600 MG 12hr tablet Take 1 tablet by mouth 2 (two) times daily as needed for cough.    Historical Provider, MD  HYDROcodone-acetaminophen (NORCO/VICODIN) 5-325 MG tablet Take 1-2 tablets by mouth every 4 (four) hours as needed for moderate pain. 09/07/16   Enedina Finner, MD  ondansetron (ZOFRAN ODT) 8 MG disintegrating tablet Take 1 tablet (8 mg total) by mouth every 8 (eight) hours as needed. 11/08/16   Payton Mccallum, MD  oseltamivir (TAMIFLU) 75 MG capsule Take 1 capsule (75 mg total) by mouth 2 (two) times daily. 11/08/16   Payton Mccallum, MD    Family History Family History  Problem Relation Age of Onset  . Cerebral aneurysm Father   . Diabetes Paternal Grandmother   . Diabetes Paternal Grandfather     Social History Social History  Substance Use Topics  . Smoking status: Current Every Day Smoker    Packs/day: 0.50    Types: Cigarettes  .  Smokeless tobacco: Current User    Types: Chew  . Alcohol use Yes     Comment: occasionally     Allergies   Other and Sulfa antibiotics   Review of Systems Review of Systems  Constitutional: Positive for chills, fatigue and fever. Negative for diaphoresis.  HENT: Positive for congestion.   Respiratory: Positive for cough. Negative for wheezing.   Gastrointestinal: Positive for diarrhea and vomiting. Negative for abdominal pain.  Musculoskeletal: Positive for myalgias. Negative for arthralgias.  Neurological: Negative for headaches.     Physical Exam Triage Vital  Signs ED Triage Vitals  Enc Vitals Group     BP 11/08/16 1951 127/84     Pulse Rate 11/08/16 1951 84     Resp 11/08/16 1951 16     Temp 11/08/16 1951 (!) 101.2 F (38.4 C)     Temp Source 11/08/16 1951 Oral     SpO2 11/08/16 1951 100 %     Weight 11/08/16 1953 235 lb (106.6 kg)     Height 11/08/16 1953 6' (1.829 m)     Head Circumference --      Peak Flow --      Pain Score --      Pain Loc --      Pain Edu? --      Excl. in GC? --    No data found.   Updated Vital Signs BP 127/84 (BP Location: Left Arm)   Pulse 84   Temp (!) 101.2 F (38.4 C) (Oral)   Resp 16   Ht 6' (1.829 m)   Wt 235 lb (106.6 kg)   SpO2 100%   BMI 31.87 kg/m   Visual Acuity Right Eye Distance:   Left Eye Distance:   Bilateral Distance:    Right Eye Near:   Left Eye Near:    Bilateral Near:     Physical Exam  Constitutional: He appears well-developed and well-nourished. No distress.  HENT:  Head: Normocephalic and atraumatic.  Right Ear: Tympanic membrane, external ear and ear canal normal.  Left Ear: Tympanic membrane, external ear and ear canal normal.  Nose: Nose normal.  Mouth/Throat: Uvula is midline, oropharynx is clear and moist and mucous membranes are normal. No oropharyngeal exudate or tonsillar abscesses.  Eyes: Conjunctivae and EOM are normal. Pupils are equal, round, and reactive to light. Right eye exhibits no discharge. Left eye exhibits no discharge. No scleral icterus.  Neck: Normal range of motion. Neck supple. No tracheal deviation present. No thyromegaly present.  Cardiovascular: Normal rate, regular rhythm and normal heart sounds.   Pulmonary/Chest: Effort normal and breath sounds normal. No stridor. No respiratory distress. He has no wheezes. He has no rales. He exhibits no tenderness.  Abdominal: Soft. Bowel sounds are normal. He exhibits no distension and no mass. There is no tenderness. There is no rebound and no guarding. No hernia.  Lymphadenopathy:    He has no  cervical adenopathy.  Neurological: He is alert.  Skin: Skin is warm and dry. No rash noted. He is not diaphoretic.  Nursing note and vitals reviewed.    UC Treatments / Results  Labs (all labs ordered are listed, but only abnormal results are displayed) Labs Reviewed  BASIC METABOLIC PANEL - Abnormal; Notable for the following:       Result Value   CO2 21 (*)    All other components within normal limits    EKG  EKG Interpretation None       Radiology No results  found.  Procedures Procedures (including critical care time)  Medications Ordered in UC Medications  ondansetron (ZOFRAN-ODT) disintegrating tablet 8 mg (8 mg Oral Given 11/08/16 2010)     Initial Impression / Assessment and Plan / UC Course  I have reviewed the triage vital signs and the nursing notes.  Pertinent labs & imaging results that were available during my care of the patient were reviewed by me and considered in my medical decision making (see chart for details).       Final Clinical Impressions(s) / UC Diagnoses   Final diagnoses:  Influenza-like illness  Gastroenteritis    New Prescriptions New Prescriptions   ONDANSETRON (ZOFRAN ODT) 8 MG DISINTEGRATING TABLET    Take 1 tablet (8 mg total) by mouth every 8 (eight) hours as needed.   OSELTAMIVIR (TAMIFLU) 75 MG CAPSULE    Take 1 capsule (75 mg total) by mouth 2 (two) times daily.   1. Lab results and diagnosis reviewed with patient 2. rx as per orders above; reviewed possible side effects, interactions, risks and benefits  3. Recommend supportive treatment with clear liquids/increased fluids, then advance slowly as tolerated; otc imodium AD  4. Follow-up prn if symptoms worsen or don't improve   Payton Mccallum, MD 11/08/16 2041

## 2016-11-08 NOTE — ED Triage Notes (Signed)
N/V/D, fever, body aches, chills, onset today.

## 2017-01-14 ENCOUNTER — Emergency Department: Payer: BLUE CROSS/BLUE SHIELD

## 2017-01-14 ENCOUNTER — Emergency Department
Admission: EM | Admit: 2017-01-14 | Discharge: 2017-01-14 | Disposition: A | Discharge status: Home / Self Care | Payer: BLUE CROSS/BLUE SHIELD | Attending: Emergency Medicine | Admitting: Emergency Medicine

## 2017-01-14 DIAGNOSIS — S0081XA Abrasion of other part of head, initial encounter: Secondary | ICD-10-CM | POA: Insufficient documentation

## 2017-01-14 DIAGNOSIS — Y929 Unspecified place or not applicable: Secondary | ICD-10-CM | POA: Insufficient documentation

## 2017-01-14 DIAGNOSIS — S42152A Displaced fracture of neck of scapula, left shoulder, initial encounter for closed fracture: Secondary | ICD-10-CM | POA: Insufficient documentation

## 2017-01-14 DIAGNOSIS — S00432A Contusion of left ear, initial encounter: Secondary | ICD-10-CM | POA: Diagnosis not present

## 2017-01-14 DIAGNOSIS — Y999 Unspecified external cause status: Secondary | ICD-10-CM | POA: Diagnosis not present

## 2017-01-14 DIAGNOSIS — S00431A Contusion of right ear, initial encounter: Secondary | ICD-10-CM | POA: Diagnosis not present

## 2017-01-14 DIAGNOSIS — F17210 Nicotine dependence, cigarettes, uncomplicated: Secondary | ICD-10-CM | POA: Insufficient documentation

## 2017-01-14 DIAGNOSIS — Y939 Activity, unspecified: Secondary | ICD-10-CM | POA: Insufficient documentation

## 2017-01-14 DIAGNOSIS — S0990XA Unspecified injury of head, initial encounter: Secondary | ICD-10-CM | POA: Diagnosis present

## 2017-01-14 DIAGNOSIS — S42142A Displaced fracture of glenoid cavity of scapula, left shoulder, initial encounter for closed fracture: Secondary | ICD-10-CM | POA: Insufficient documentation

## 2017-01-14 DIAGNOSIS — F17220 Nicotine dependence, chewing tobacco, uncomplicated: Secondary | ICD-10-CM | POA: Insufficient documentation

## 2017-01-14 LAB — CBC
HEMATOCRIT: 42.6 % (ref 40.0–52.0)
HEMOGLOBIN: 14.9 g/dL (ref 13.0–18.0)
MCH: 31.6 pg (ref 26.0–34.0)
MCHC: 34.9 g/dL (ref 32.0–36.0)
MCV: 90.5 fL (ref 80.0–100.0)
Platelets: 241 10*3/uL (ref 150–440)
RBC: 4.71 MIL/uL (ref 4.40–5.90)
RDW: 13.5 % (ref 11.5–14.5)
WBC: 11.3 10*3/uL — AB (ref 3.8–10.6)

## 2017-01-14 LAB — BASIC METABOLIC PANEL
ANION GAP: 10 (ref 5–15)
BUN: 7 mg/dL (ref 6–20)
CALCIUM: 8.7 mg/dL — AB (ref 8.9–10.3)
CHLORIDE: 103 mmol/L (ref 101–111)
CO2: 22 mmol/L (ref 22–32)
Creatinine, Ser: 0.7 mg/dL (ref 0.61–1.24)
GFR calc Af Amer: >60 mL/min (ref >60)
GFR calc non Af Amer: >60 mL/min (ref >60)
GLUCOSE: 95 mg/dL (ref 65–99)
POTASSIUM: 3.4 mmol/L — AB (ref 3.5–5.1)
Sodium: 135 mmol/L (ref 135–145)

## 2017-01-14 MED ORDER — HYDROMORPHONE HCL 1 MG/ML IJ SOLN
1.0000 mg | Freq: Once | INTRAMUSCULAR | Status: AC
  Administered 2017-01-14: 1 mg via INTRAVENOUS
  Filled 2017-01-14: qty 1

## 2017-01-14 MED ORDER — SODIUM CHLORIDE 0.9 % IV BOLUS (SEPSIS)
1000.0000 mL | Freq: Once | INTRAVENOUS | Status: AC
  Administered 2017-01-14: 1000 mL via INTRAVENOUS

## 2017-01-14 MED ORDER — HYDROMORPHONE HCL 1 MG/ML IJ SOLN
1.0000 mg | Freq: Once | INTRAMUSCULAR | Status: AC
  Administered 2017-01-14: 1 mg via INTRAVENOUS
  Filled 2017-01-14 (×2): qty 1

## 2017-01-14 MED ORDER — OXYCODONE-ACETAMINOPHEN 7.5-325 MG PO TABS: 1.0000 | ORAL_TABLET | ORAL | 0 refills | Status: AC | PRN

## 2017-01-14 NOTE — ED Notes (Signed)
Iv removed. Lm edt
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
## 2017-01-18 ENCOUNTER — Ambulatory Visit
Admission: RE | Admit: 2017-01-18 | Discharge: 2017-01-18 | Disposition: A | Discharge status: Home / Self Care | Payer: BLUE CROSS/BLUE SHIELD | Source: Ambulatory Visit | Attending: Orthopedic Surgery | Admitting: Orthopedic Surgery

## 2017-01-18 ENCOUNTER — Other Ambulatory Visit: Payer: Self-pay | Admitting: Orthopedic Surgery

## 2017-01-18 DIAGNOSIS — S42115A Nondisplaced fracture of body of scapula, left shoulder, initial encounter for closed fracture: Secondary | ICD-10-CM

## 2017-01-18 DIAGNOSIS — S42112A Displaced fracture of body of scapula, left shoulder, initial encounter for closed fracture: Secondary | ICD-10-CM | POA: Insufficient documentation

## 2017-01-18 DIAGNOSIS — S42145A Nondisplaced fracture of glenoid cavity of scapula, left shoulder, initial encounter for closed fracture: Secondary | ICD-10-CM

## 2017-01-18 DIAGNOSIS — X58XXXA Exposure to other specified factors, initial encounter: Secondary | ICD-10-CM | POA: Insufficient documentation

## 2017-01-22 ENCOUNTER — Ambulatory Visit
Admission: RE | Admit: 2017-01-22 | Discharge: 2017-01-22 | Disposition: A | Discharge status: Home / Self Care | Payer: BLUE CROSS/BLUE SHIELD | Source: Ambulatory Visit | Attending: Surgery | Admitting: Surgery

## 2017-01-22 ENCOUNTER — Other Ambulatory Visit: Payer: Self-pay | Admitting: Surgery

## 2017-01-22 DIAGNOSIS — S42152A Displaced fracture of neck of scapula, left shoulder, initial encounter for closed fracture: Secondary | ICD-10-CM | POA: Insufficient documentation

## 2017-01-22 DIAGNOSIS — X58XXXA Exposure to other specified factors, initial encounter: Secondary | ICD-10-CM | POA: Insufficient documentation
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
## 2023-11-26 ENCOUNTER — Other Ambulatory Visit: Payer: Self-pay | Admitting: Internal Medicine

## 2023-11-26 DIAGNOSIS — R001 Bradycardia, unspecified: Secondary | ICD-10-CM

## 2023-12-07 ENCOUNTER — Ambulatory Visit
Admission: RE | Admit: 2023-12-07 | Discharge: 2023-12-07 | Disposition: A | Discharge status: Home / Self Care | Payer: Self-pay | Source: Ambulatory Visit | Attending: Internal Medicine | Admitting: Internal Medicine

## 2023-12-07 DIAGNOSIS — R001 Bradycardia, unspecified: Secondary | ICD-10-CM | POA: Insufficient documentation
